# Patient Record
Sex: Male | Born: 1995 | Race: Black or African American | Hispanic: No | Marital: Single | Smoking: Current every day smoker
Health system: Southern US, Community
[De-identification: ages and names within clinical notes are randomized; demographics above are authoritative.]

---
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
## 2015-09-08 IMAGING — CR DG SHOULDER 2+V*R*
2 series · 2 of 2 positions shown · non-contrast
Comparison: None.

CLINICAL DATA: Fall.

EXAM:
RIGHT SHOULDER - 2+ VIEW

[w shoulder ap internal righ]
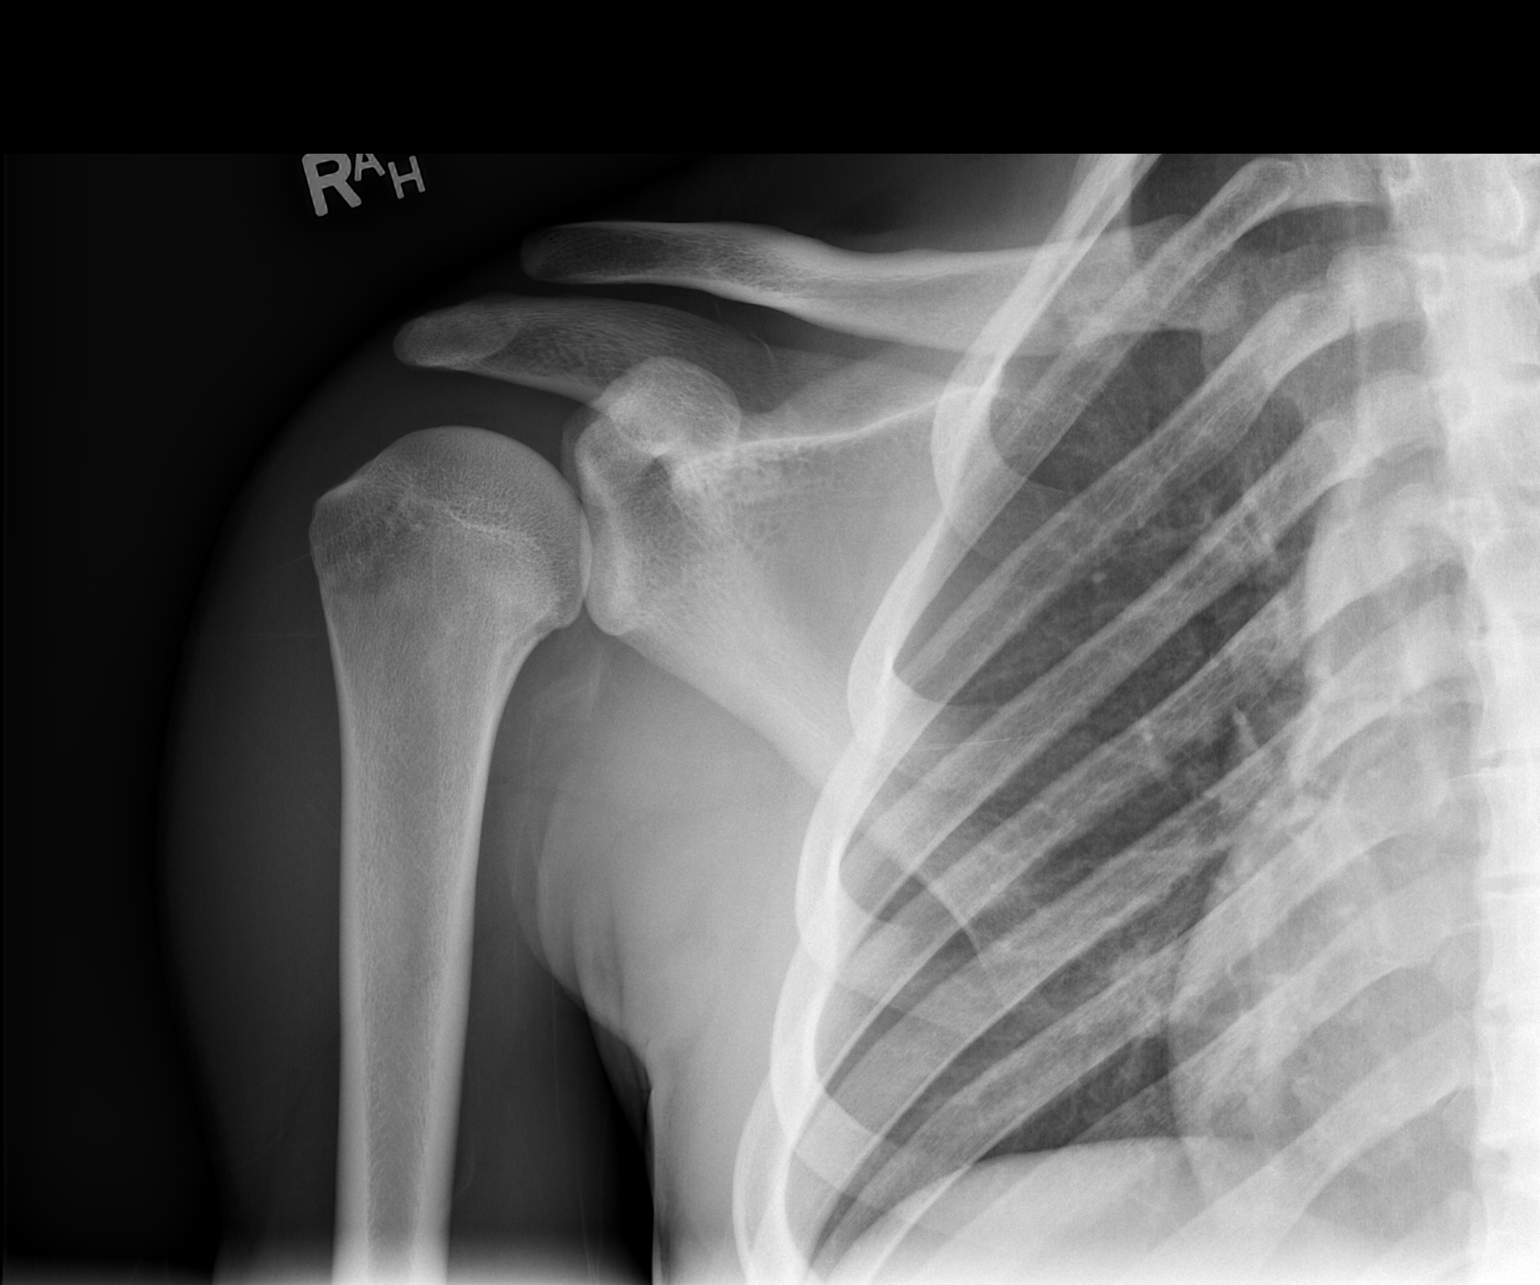

[w shoulder y view right]
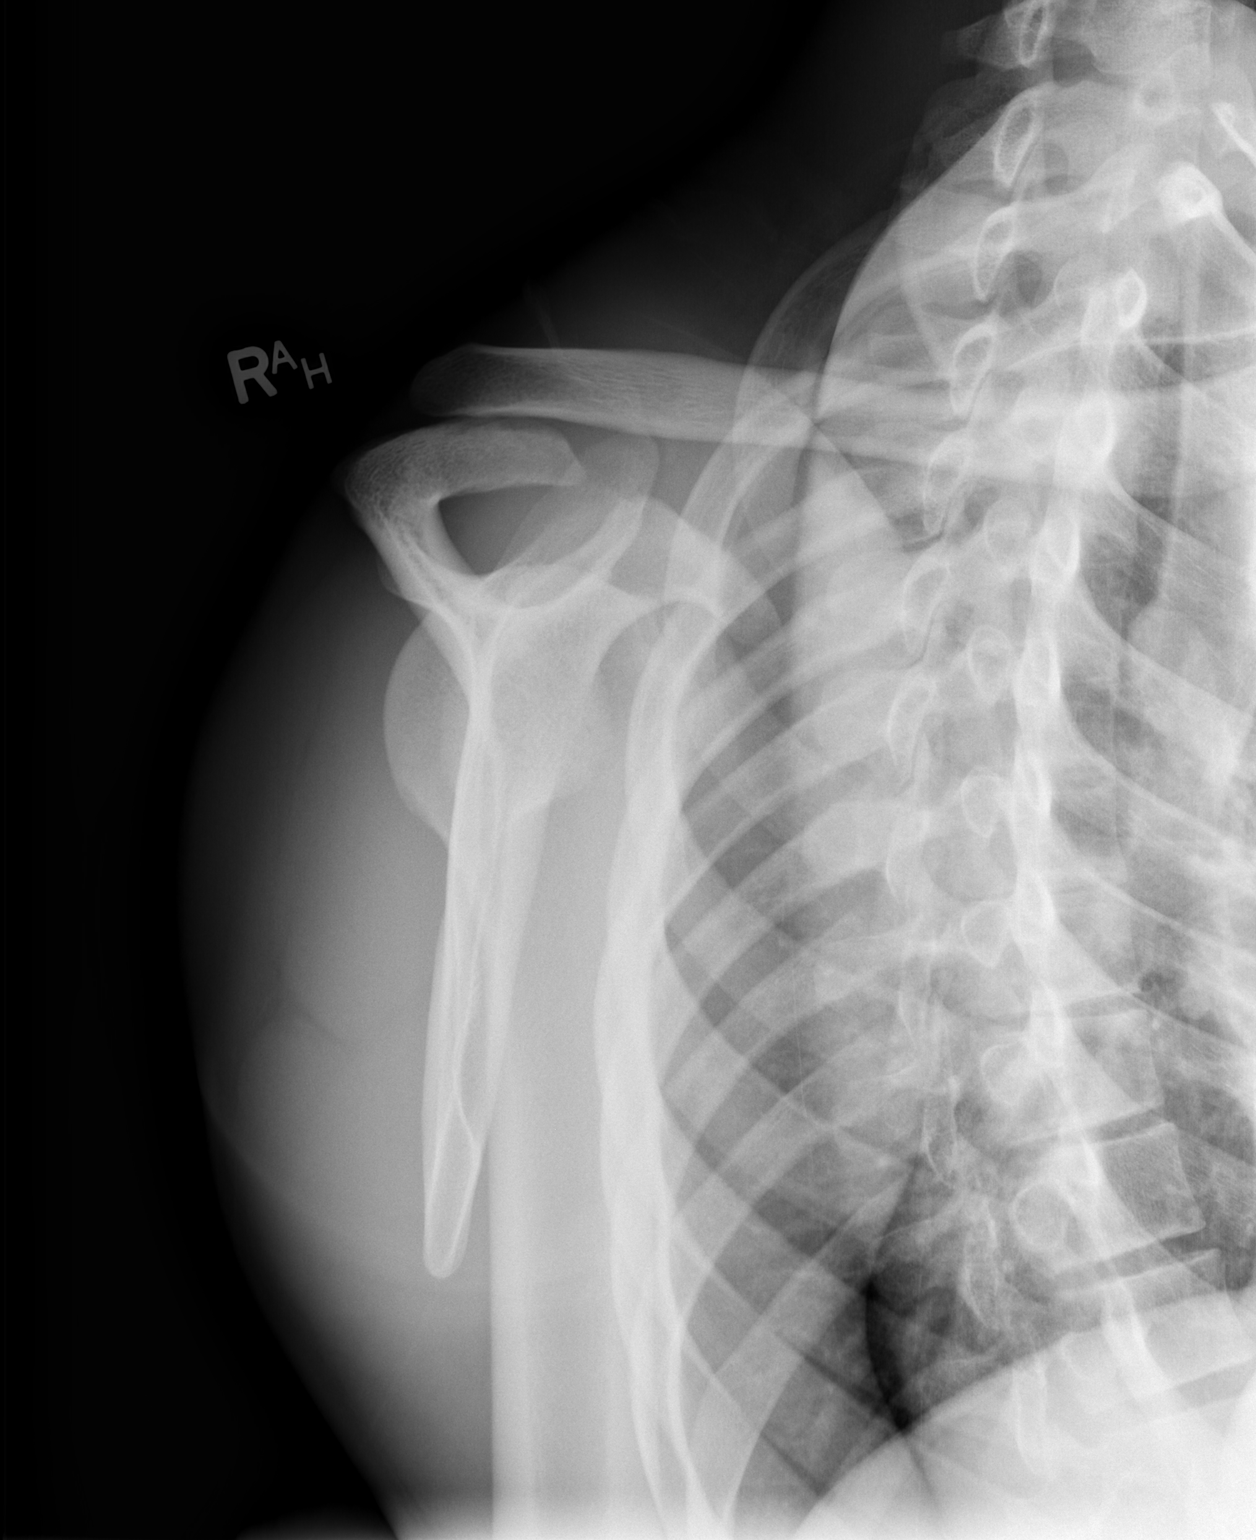

[2 of 2 positions shown; findings below may reference images not displayed]

FINDINGS: Right acromioclavicular separation cannot be excluded. AP views of
both shoulders with without weights should be considered to further
evaluate. No evidence of fracture or dislocation.
IMPRESSION: Cannot exclude right shoulder AC separation. AP views of both
shoulders with and without weights suggested to further evaluate.

## 2017-05-21 ENCOUNTER — Encounter (HOSPITAL_BASED_OUTPATIENT_CLINIC_OR_DEPARTMENT_OTHER): Payer: Self-pay

## 2017-05-21 ENCOUNTER — Emergency Department (HOSPITAL_BASED_OUTPATIENT_CLINIC_OR_DEPARTMENT_OTHER)
Admission: EM | Admit: 2017-05-21 | Discharge: 2017-05-21 | Disposition: A | Discharge status: Home / Self Care | Payer: Self-pay | Attending: Emergency Medicine | Admitting: Emergency Medicine

## 2017-05-21 ENCOUNTER — Other Ambulatory Visit: Payer: Self-pay

## 2017-05-21 DIAGNOSIS — F17210 Nicotine dependence, cigarettes, uncomplicated: Secondary | ICD-10-CM | POA: Insufficient documentation

## 2017-05-21 DIAGNOSIS — Z202 Contact with and (suspected) exposure to infections with a predominantly sexual mode of transmission: Secondary | ICD-10-CM | POA: Insufficient documentation

## 2017-05-21 MED ORDER — PENICILLIN G BENZATHINE 1200000 UNIT/2ML IM SUSP
2.4000 10*6.[IU] | Freq: Once | INTRAMUSCULAR | Status: AC
  Administered 2017-05-21: 2.4 10*6.[IU] via INTRAMUSCULAR
  Filled 2017-05-21: qty 4

## 2017-05-21 MED ORDER — AZITHROMYCIN 250 MG PO TABS
1000.0000 mg | ORAL_TABLET | Freq: Once | ORAL | Status: AC
  Administered 2017-05-21: 1000 mg via ORAL
  Filled 2017-05-21: qty 4

## 2017-05-21 MED ORDER — CEFTRIAXONE SODIUM 250 MG IJ SOLR
250.0000 mg | Freq: Once | INTRAMUSCULAR | Status: AC
  Administered 2017-05-21: 250 mg via INTRAMUSCULAR
  Filled 2017-05-21: qty 250

## 2017-05-21 NOTE — ED Provider Notes (Signed)
[facility] EMERGENCY DEPARTMENT Provider Note   CSN: [id number] Arrival date & time: 05/21/17  1816     History   Chief Complaint Chief Complaint  Patient presents with  . Exposure to STD    HPI Brian Bell is a 22 y.o. male w PMHx of gonorrhea, presenting to the ED with positive exposure to syphillis. Pt states his male sexual partner was recently told she had tested positive for syphilis. He states he is only sexually active with this one male partner without protection. Denies lesions, rashes, dysuria, abd pain, pain with defecation, penile discharge, testicular pain or swelling, or other complaints.  The history is provided by the patient.    History reviewed. No pertinent past medical history.  There are no active problems to display for this patient.   History reviewed. No pertinent surgical history.      Home Medications    Prior to Admission medications   Medication Sig Start Date End Date Taking? Authorizing Provider  HYDROcodone-acetaminophen (NORCO) 5-325 MG per tablet Take 1 tablet by mouth every 6 (six) hours as needed for moderate pain. 09/01/13   Purvis Sheffield, MD    Family History No family history on file.  Social History Social History   Tobacco Use  . Smoking status: Current Every Day Smoker    Packs/day: 1.00    Types: Cigarettes  . Smokeless tobacco: Never Used  Substance Use Topics  . Alcohol use: No  . Drug use: No     Allergies   Patient has no known allergies.   Review of Systems Review of Systems  Constitutional: Negative for fever.  Gastrointestinal: Negative for abdominal pain.  Genitourinary: Negative for discharge, dysuria, penile pain, penile swelling, scrotal swelling and testicular pain.  Skin: Negative for rash.     Physical Exam Updated Vital Signs BP 140/69 (BP Location: Left Arm)   Pulse 83   Temp 98.2 F (36.8 C) (Oral)   Resp 16   Ht 6' (1.829 m)   Wt 68.5 kg (151 lb 0.2 oz)    SpO2 100%   BMI 20.48 kg/m   Physical Exam  Constitutional: He appears well-developed and well-nourished. No distress.  HENT:  Head: Normocephalic and atraumatic.  Eyes: Conjunctivae are normal.  Cardiovascular: Normal rate and intact distal pulses.  Pulmonary/Chest: Effort normal.  Abdominal: Soft. Bowel sounds are normal. He exhibits no distension. There is no tenderness.  Genitourinary: Right testis shows no mass, no swelling and no tenderness. Left testis shows no mass, no swelling and no tenderness. Circumcised. No penile tenderness. No discharge found.  Genitourinary Comments: Exam performed with RN chaperone present.  Psychiatric: He has a normal mood and affect. His behavior is normal.  Nursing note and vitals reviewed.    ED Treatments / Results  Labs (all labs ordered are listed, but only abnormal results are displayed) Labs Reviewed  RPR  HIV ANTIBODY (ROUTINE TESTING)  GC/CHLAMYDIA PROBE AMP ([place]) NOT AT [facility]    EKG None  Radiology No results found.  Procedures Procedures (including critical care time)  Medications Ordered in ED Medications  cefTRIAXone (ROCEPHIN) injection 250 mg (250 mg Intramuscular Given 05/21/17 1850)  azithromycin (ZITHROMAX) tablet 1,000 mg (1,000 mg Oral Given 05/21/17 1853)  penicillin g benzathine (BICILLIN LA) 1200000 UNIT/2ML injection 2.4 Million Units (2.4 Million Units Intramuscular Given 05/21/17 1847)     Initial Impression / Assessment and Plan / ED Course  I have reviewed the triage vital signs and the nursing notes.  Pertinent labs & imaging results that were available during my care of the patient were reviewed by me and considered in my medical decision making (see chart for details).     Patient positive exposure to syphilis.  Patient is afebrile without abdominal tenderness, abdominal pain or painful bowel movements to indicate prostatitis.  No tenderness to palpation of the testes or epididymis to suggest  orchitis or epididymitis.  STD cultures obtained including HIV, syphilis, gonorrhea and chlamydia. Patient has been treated prophylactically with azithromycin and Rocephin, as well as penicillin for syphilis.  Patient to be discharged with instructions to follow up with PCP. Discussed importance of using protection when sexually active. Pt understands that they have STD cultures pending and that they will need to inform all sexual partners if results return positive.   Discussed results, findings, treatment and follow up. Patient advised of return precautions. Patient verbalized understanding and agreed with plan.  Final Clinical Impressions(s) / ED Diagnoses   Final diagnoses:  Exposure to syphilis    ED Discharge Orders    None       Dametri Ozburn, [place] N, PA-C 05/21/17 1924    Melene PlanFloyd, Dan, DO 05/21/17 2259

## 2017-05-21 NOTE — Discharge Instructions (Addendum)
Please read the instructions below.  Talk with your primary care provider about any new medications.  You have been treated today for syphilis, gonorrhea and chlamydia. You will receive a call from the hospital if your test results come back positive. Avoid sexual activity until you know your test results. If your results come back positive, it is important that you inform all of your sexual partners.  Return to the ER for new or worsening symptoms.

## 2017-05-21 NOTE — ED Triage Notes (Signed)
Pt with pos syphilis exposure-NAD-steady gait

## 2017-05-22 LAB — RPR: RPR Ser Ql: NONREACTIVE

## 2017-05-22 LAB — GC/CHLAMYDIA PROBE AMP (~~LOC~~) NOT AT ARMC
Chlamydia: NEGATIVE
NEISSERIA GONORRHEA: NEGATIVE

## 2017-05-22 LAB — HIV ANTIBODY (ROUTINE TESTING W REFLEX): HIV SCREEN 4TH GENERATION: NONREACTIVE

## 2019-02-16 ENCOUNTER — Emergency Department (HOSPITAL_BASED_OUTPATIENT_CLINIC_OR_DEPARTMENT_OTHER)
Admission: EM | Admit: 2019-02-16 | Discharge: 2019-02-16 | Disposition: A | Discharge status: Home / Self Care | Payer: Self-pay | Attending: Emergency Medicine | Admitting: Emergency Medicine

## 2019-02-16 ENCOUNTER — Encounter (HOSPITAL_BASED_OUTPATIENT_CLINIC_OR_DEPARTMENT_OTHER): Payer: Self-pay | Admitting: Emergency Medicine

## 2019-02-16 ENCOUNTER — Other Ambulatory Visit: Payer: Self-pay

## 2019-02-16 DIAGNOSIS — N341 Nonspecific urethritis: Secondary | ICD-10-CM | POA: Insufficient documentation

## 2019-02-16 DIAGNOSIS — F17210 Nicotine dependence, cigarettes, uncomplicated: Secondary | ICD-10-CM | POA: Insufficient documentation

## 2019-02-16 DIAGNOSIS — Z202 Contact with and (suspected) exposure to infections with a predominantly sexual mode of transmission: Secondary | ICD-10-CM | POA: Insufficient documentation

## 2019-02-16 DIAGNOSIS — N342 Other urethritis: Secondary | ICD-10-CM

## 2019-02-16 LAB — HIV ANTIBODY (ROUTINE TESTING W REFLEX): HIV Screen 4th Generation wRfx: NONREACTIVE

## 2019-02-16 MED ORDER — AZITHROMYCIN 1 G PO PACK
1.0000 g | PACK | Freq: Once | ORAL | Status: AC
  Administered 2019-02-16: 1 g via ORAL
  Filled 2019-02-16: qty 1

## 2019-02-16 MED ORDER — CEFTRIAXONE SODIUM 500 MG IJ SOLR
500.0000 mg | Freq: Once | INTRAMUSCULAR | Status: AC
  Administered 2019-02-16: 02:00:00 500 mg via INTRAMUSCULAR
  Filled 2019-02-16: qty 500

## 2019-02-16 NOTE — ED Triage Notes (Signed)
Exposure to Chlamydia. Discharge for 3 days.

## 2019-02-16 NOTE — ED Provider Notes (Signed)
   [facility] Provider Note: Lowella Dell, MD, FACEP  CSN: [id number] MRN: [id number] ARRIVAL: 02/16/19 at 0200 ROOM: MH12/MH12   CHIEF COMPLAINT  Exposure to STD   HISTORY OF PRESENT ILLNESS  02/16/19 2:13 AM Brian Bell is a 24 y.o. male who states he has been exposed to chlamydia.  He is complaining of a clear urethral discharge for the past 3 days.  There is associated burning pain at the urethral meatus which he rates as a 3 out of 10, worse with urination.  He denies abdominal pain.   History reviewed. No pertinent past medical history.  History reviewed. No pertinent surgical history.  No family history on file.  Social History   Tobacco Use  . Smoking status: Current Every Day Smoker    Packs/day: 1.00    Types: Cigarettes  . Smokeless tobacco: Never Used  Substance Use Topics  . Alcohol use: No  . Drug use: No    Prior to Admission medications   Not on File    Allergies Patient has no known allergies.   REVIEW OF SYSTEMS  Negative except as noted here or in the History of Present Illness.   PHYSICAL EXAMINATION  Initial Vital Signs Blood pressure 119/71, pulse 78, temperature 98.2 F (36.8 C), temperature source Oral, resp. rate 20, weight 68.5 kg, SpO2 99 %.  Examination General: Well-developed, well-nourished male in no acute distress; appearance consistent with age of record HENT: normocephalic; atraumatic Eyes: Normal appearance Neck: supple Heart: regular rate and rhythm Lungs: clear to auscultation bilaterally Abdomen: soft; nondistended; nontender GU: Tanner V male, circumcised; no urethral discharge or meatal erythema seen Extremities: No deformity; full range of motion Neurologic: Awake, alert and oriented; motor function intact in all extremities and symmetric; no facial droop Skin: Warm and dry Psychiatric: Normal mood and affect   RESULTS  Summary of this visit's results, reviewed and interpreted by myself:   EKG Interpretation  Date/Time:    Ventricular Rate:    PR Interval:    QRS Duration:   QT Interval:    QTC Calculation:   R Axis:     Text Interpretation:        Laboratory Studies: No results found for this or any previous visit (from the past 24 hour(s)). Imaging Studies: No results found.  ED COURSE and MDM  Nursing notes, initial and subsequent vitals signs, including pulse oximetry, reviewed and interpreted by myself.  Vitals:   02/16/19 0206 02/16/19 0207  BP: 119/71   Pulse: 78   Resp: 20   Temp: 98.2 F (36.8 C)   TempSrc: Oral   SpO2: 99%   Weight:  68.5 kg   Medications  azithromycin (ZITHROMAX) powder 1 g (has no administration in time range)  cefTRIAXone (ROCEPHIN) injection 500 mg (has no administration in time range)   We will treat for gonorrhea and chlamydia.  STD testing sent.   PROCEDURES  Procedures   ED DIAGNOSES     ICD-10-CM   1. Urethritis  N34.2   2. Exposure to STD  Z20.2        Paula Libra, MD 02/16/19 [phone]

## 2019-02-17 LAB — GC/CHLAMYDIA PROBE AMP (~~LOC~~) NOT AT ARMC
Chlamydia: NEGATIVE
Neisseria Gonorrhea: POSITIVE — AB

## 2019-02-17 LAB — RPR: RPR Ser Ql: NONREACTIVE

## 2019-05-24 ENCOUNTER — Encounter (HOSPITAL_BASED_OUTPATIENT_CLINIC_OR_DEPARTMENT_OTHER): Payer: Self-pay | Admitting: Emergency Medicine

## 2019-05-24 DIAGNOSIS — F17210 Nicotine dependence, cigarettes, uncomplicated: Secondary | ICD-10-CM | POA: Insufficient documentation

## 2019-05-24 DIAGNOSIS — N201 Calculus of ureter: Secondary | ICD-10-CM | POA: Insufficient documentation

## 2019-05-24 LAB — URINALYSIS, MICROSCOPIC (REFLEX)
Bacteria, UA: NONE SEEN
RBC / HPF: >50 RBC/hpf (ref 0–5)
Squamous Epithelial / LPF: NONE SEEN (ref 0–5)

## 2019-05-24 LAB — URINALYSIS, ROUTINE W REFLEX MICROSCOPIC
Bilirubin Urine: NEGATIVE
Glucose, UA: NEGATIVE mg/dL
Ketones, ur: 15 mg/dL — AB
Nitrite: NEGATIVE
Protein, ur: 30 mg/dL — AB
Specific Gravity, Urine: 1.025 (ref 1.005–1.030)
pH: 6.5 (ref 5.0–8.0)

## 2019-05-24 NOTE — ED Triage Notes (Signed)
Pt c/o right flank pain

## 2019-05-25 ENCOUNTER — Emergency Department (HOSPITAL_BASED_OUTPATIENT_CLINIC_OR_DEPARTMENT_OTHER)
Admission: EM | Admit: 2019-05-25 | Discharge: 2019-05-25 | Disposition: A | Discharge status: Home / Self Care | Payer: Self-pay | Attending: Emergency Medicine | Admitting: Emergency Medicine

## 2019-05-25 ENCOUNTER — Other Ambulatory Visit: Payer: Self-pay

## 2019-05-25 ENCOUNTER — Emergency Department (HOSPITAL_BASED_OUTPATIENT_CLINIC_OR_DEPARTMENT_OTHER): Payer: Self-pay

## 2019-05-25 DIAGNOSIS — N201 Calculus of ureter: Secondary | ICD-10-CM

## 2019-05-25 MED ORDER — TAMSULOSIN HCL 0.4 MG PO CAPS
0.4000 mg | ORAL_CAPSULE | Freq: Once | ORAL | Status: AC
  Administered 2019-05-25: 0.4 mg via ORAL
  Filled 2019-05-25: qty 1

## 2019-05-25 MED ORDER — ONDANSETRON HCL 4 MG/2ML IJ SOLN
4.0000 mg | Freq: Once | INTRAMUSCULAR | Status: AC
Start: 2019-05-25 — End: 2019-05-25
  Administered 2019-05-25: 02:00:00 4 mg via INTRAVENOUS
  Filled 2019-05-25: qty 2

## 2019-05-25 MED ORDER — HYDROMORPHONE HCL 1 MG/ML IJ SOLN
1.0000 mg | Freq: Once | INTRAMUSCULAR | Status: AC
  Administered 2019-05-25: 1 mg via INTRAVENOUS
  Filled 2019-05-25: qty 1

## 2019-05-25 MED ORDER — HYDROMORPHONE HCL 2 MG PO TABS: 2.0000 mg | ORAL_TABLET | ORAL | 0 refills | Status: AC | PRN

## 2019-05-25 MED ORDER — ONDANSETRON 8 MG PO TBDP: 8.0000 mg | ORAL_TABLET | Freq: Three times a day (TID) | ORAL | 0 refills | Status: DC | PRN

## 2019-05-25 MED ORDER — TAMSULOSIN HCL 0.4 MG PO CAPS: ORAL_CAPSULE | ORAL | 0 refills | Status: AC

## 2019-05-25 NOTE — ED Provider Notes (Signed)
[facility] Provider Note: Lowella Dell, MD, FACEP  CSN: [id number] MRN: [id number] ARRIVAL: 05/24/19 at 2308 ROOM: MH08/MH08   CHIEF COMPLAINT  Flank Pain   HISTORY OF PRESENT ILLNESS  05/25/19 1:31 AM Brian Bell is a 24 y.o. male who complains of a 4-day history of intermittent pain in his right flank radiating to his right lower quadrant.  He rates the pain is an 8 out of 10.  It is not significantly made better or worse with anything.  He has not noticed hematuria but has felt constipated.  He has had associated nausea and vomiting.  He has no history of kidney stones.   History reviewed. No pertinent past medical history.  History reviewed. No pertinent surgical history.  No family history on file.  Social History   Tobacco Use  . Smoking status: Current Every Day Smoker    Packs/day: 1.00    Types: Cigarettes  . Smokeless tobacco: Never Used  Substance Use Topics  . Alcohol use: No  . Drug use: No    Prior to Admission medications   Medication Sig Start Date End Date Taking? Authorizing Provider  HYDROmorphone (DILAUDID) 2 MG tablet Take 1 tablet (2 mg total) by mouth every 4 (four) hours as needed for severe pain. 05/25/19   Aayden Cefalu, Jonny Ruiz, MD  ondansetron (ZOFRAN ODT) 8 MG disintegrating tablet Take 1 tablet (8 mg total) by mouth every 8 (eight) hours as needed for nausea or vomiting. 05/25/19   Zella Dewan, MD  tamsulosin (FLOMAX) 0.4 MG CAPS capsule Take 1 tablet daily until stone passes. 05/25/19   Ameri Cahoon, Jonny Ruiz, MD    Allergies Patient has no known allergies.   REVIEW OF SYSTEMS  Negative except as noted here or in the History of Present Illness.   PHYSICAL EXAMINATION  Initial Vital Signs Blood pressure 126/68, pulse 71, temperature 98.8 F (37.1 C), temperature source Oral, resp. rate 16, height 6' (1.829 m), weight 70.3 kg, SpO2 100 %.  Examination General: Well-developed, well-nourished male in no acute distress; appearance  consistent with age of record HENT: normocephalic; atraumatic Eyes: Normal appearance Neck: supple Heart: regular rate and rhythm Lungs: clear to auscultation bilaterally Abdomen: soft; nondistended; right lower quadrant tenderness; bowel sounds present GU: Right CVA tenderness Extremities: No deformity; full range of motion Neurologic: Awake, alert and oriented; motor function intact in all extremities and symmetric; no facial droop Skin: Warm and dry Psychiatric: Agitated   RESULTS  Summary of this visit's results, reviewed and interpreted by myself:   EKG Interpretation  Date/Time:    Ventricular Rate:    PR Interval:    QRS Duration:   QT Interval:    QTC Calculation:   R Axis:     Text Interpretation:        Laboratory Studies: Results for orders placed or performed during the hospital encounter of 05/25/19 (from the past 24 hour(s))  Urinalysis, Routine w reflex microscopic     Status: Abnormal   Collection Time: 05/24/19 11:29 PM  Result Value Ref Range   Color, Urine YELLOW YELLOW   APPearance CLOUDY (A) CLEAR   Specific Gravity, Urine 1.025 1.005 - 1.030   pH 6.5 5.0 - 8.0   Glucose, UA NEGATIVE NEGATIVE mg/dL   Hgb urine dipstick LARGE (A) NEGATIVE   Bilirubin Urine NEGATIVE NEGATIVE   Ketones, ur 15 (A) NEGATIVE mg/dL   Protein, ur 30 (A) NEGATIVE mg/dL   Nitrite NEGATIVE NEGATIVE   Leukocytes,Ua TRACE (A) NEGATIVE  Urinalysis,  Microscopic (reflex)     Status: None   Collection Time: 05/24/19 11:29 PM  Result Value Ref Range   RBC / HPF >50 0 - 5 RBC/hpf   WBC, UA 0-5 0 - 5 WBC/hpf   Bacteria, UA NONE SEEN NONE SEEN   Squamous Epithelial / LPF NONE SEEN 0 - 5   Mucus PRESENT    Ca Oxalate Crys, UA PRESENT    Imaging Studies: CT Renal Stone Study  Result Date: 05/25/2019 CLINICAL DATA:  Right flank pain x3 days. EXAM: CT ABDOMEN AND PELVIS WITHOUT CONTRAST TECHNIQUE: Multidetector CT imaging of the abdomen and pelvis was performed following the  standard protocol without IV contrast. COMPARISON:  None. FINDINGS: Lower chest: No acute abnormality. Hepatobiliary: No focal liver abnormality is seen. No gallstones, gallbladder wall thickening, or biliary dilatation. Pancreas: Unremarkable. No pancreatic ductal dilatation or surrounding inflammatory changes. Spleen: Normal in size without focal abnormality. Adrenals/Urinary Tract: Adrenal glands are unremarkable. Kidneys are normal in size without focal lesions. A 4 mm obstructing renal stone is seen within the mid right ureter with mild right-sided hydronephrosis and hydroureter. A 3 mm nonobstructing renal stone is seen within the left kidney. Bladder is unremarkable. Stomach/Bowel: Stomach is within normal limits. Appendix appears normal. No evidence of bowel wall thickening, distention, or inflammatory changes. Vascular/Lymphatic: No significant vascular findings are present. No enlarged abdominal or pelvic lymph nodes. Reproductive: Prostate is unremarkable. Other: No abdominal wall hernia or abnormality. No abdominopelvic ascites. Musculoskeletal: No acute or significant osseous findings. IMPRESSION: 1. 4 mm obstructing renal stone within the mid right ureter with mild right-sided hydronephrosis and hydroureter. 2. 3 mm nonobstructing renal stone within the left kidney. Electronically Signed   By: Virgina Norfolk M.D.   On: 05/25/2019 00:45    ED COURSE and MDM  Nursing notes, initial and subsequent vitals signs, including pulse oximetry, reviewed and interpreted by myself.  Vitals:   05/24/19 2324 05/24/19 2326  BP:  126/68  Pulse:  71  Resp:  16  Temp:  98.8 F (37.1 C)  TempSrc:  Oral  SpO2:  100%  Weight: 70.3 kg   Height: 6' (1.829 m)    Medications  tamsulosin (FLOMAX) capsule 0.4 mg (has no administration in time range)  ondansetron (ZOFRAN) injection 4 mg (4 mg Intravenous Given 05/25/19 0147)  HYDROmorphone (DILAUDID) injection 1 mg (1 mg Intravenous Given 05/25/19 0147)     2:05 AM Patient resting comfortably after IV medications.  We will treat him for ureteral colic and refer to [facility] if stone does not pass on its own.  PROCEDURES  Procedures   ED DIAGNOSES     ICD-10-CM   1. Ureterolithiasis  N20.1        Haniyah Maciolek, MD 05/25/19 [phone]

## 2019-06-07 ENCOUNTER — Other Ambulatory Visit: Payer: Self-pay

## 2019-06-07 ENCOUNTER — Emergency Department (HOSPITAL_BASED_OUTPATIENT_CLINIC_OR_DEPARTMENT_OTHER)
Admission: EM | Admit: 2019-06-07 | Discharge: 2019-06-08 | Disposition: A | Discharge status: Home / Self Care | Payer: Self-pay | Attending: Emergency Medicine | Admitting: Emergency Medicine

## 2019-06-07 DIAGNOSIS — N23 Unspecified renal colic: Secondary | ICD-10-CM | POA: Insufficient documentation

## 2019-06-07 DIAGNOSIS — F17210 Nicotine dependence, cigarettes, uncomplicated: Secondary | ICD-10-CM | POA: Insufficient documentation

## 2019-06-08 ENCOUNTER — Encounter (HOSPITAL_BASED_OUTPATIENT_CLINIC_OR_DEPARTMENT_OTHER): Payer: Self-pay | Admitting: *Deleted

## 2019-06-08 ENCOUNTER — Emergency Department (HOSPITAL_BASED_OUTPATIENT_CLINIC_OR_DEPARTMENT_OTHER): Payer: Self-pay

## 2019-06-08 LAB — URINALYSIS, MICROSCOPIC (REFLEX): Bacteria, UA: NONE SEEN

## 2019-06-08 LAB — BASIC METABOLIC PANEL
Anion gap: 9 (ref 5–15)
BUN: 8 mg/dL (ref 6–20)
CO2: 24 mmol/L (ref 22–32)
Calcium: 9 mg/dL (ref 8.9–10.3)
Chloride: 105 mmol/L (ref 98–111)
Creatinine, Ser: 1.17 mg/dL (ref 0.61–1.24)
GFR calc Af Amer: >60 mL/min (ref >60)
GFR calc non Af Amer: >60 mL/min (ref >60)
Glucose, Bld: 84 mg/dL (ref 70–99)
Potassium: 3.7 mmol/L (ref 3.5–5.1)
Sodium: 138 mmol/L (ref 135–145)

## 2019-06-08 LAB — CBC WITH DIFFERENTIAL/PLATELET
Abs Immature Granulocytes: 0.02 10*3/uL (ref 0.00–0.07)
Basophils Absolute: 0 10*3/uL (ref 0.0–0.1)
Basophils Relative: 0 %
Eosinophils Absolute: 0.1 10*3/uL (ref 0.0–0.5)
Eosinophils Relative: 2 %
HCT: 38.3 % — ABNORMAL LOW (ref 39.0–52.0)
Hemoglobin: 12.9 g/dL — ABNORMAL LOW (ref 13.0–17.0)
Immature Granulocytes: 0 %
Lymphocytes Relative: 49 %
Lymphs Abs: 4.2 10*3/uL — ABNORMAL HIGH (ref 0.7–4.0)
MCH: 29.5 pg (ref 26.0–34.0)
MCHC: 33.7 g/dL (ref 30.0–36.0)
MCV: 87.4 fL (ref 80.0–100.0)
Monocytes Absolute: 0.8 10*3/uL (ref 0.1–1.0)
Monocytes Relative: 10 %
Neutro Abs: 3.3 10*3/uL (ref 1.7–7.7)
Neutrophils Relative %: 39 %
Platelets: 247 10*3/uL (ref 150–400)
RBC: 4.38 MIL/uL (ref 4.22–5.81)
RDW: 12.6 % (ref 11.5–15.5)
WBC: 8.5 10*3/uL (ref 4.0–10.5)
nRBC: 0 % (ref 0.0–0.2)

## 2019-06-08 LAB — URINALYSIS, ROUTINE W REFLEX MICROSCOPIC
Bilirubin Urine: NEGATIVE
Glucose, UA: NEGATIVE mg/dL
Ketones, ur: NEGATIVE mg/dL
Leukocytes,Ua: NEGATIVE
Nitrite: NEGATIVE
Protein, ur: NEGATIVE mg/dL
Specific Gravity, Urine: 1.025 (ref 1.005–1.030)
pH: 6 (ref 5.0–8.0)

## 2019-06-08 MED ORDER — MORPHINE SULFATE (PF) 4 MG/ML IV SOLN
4.0000 mg | Freq: Once | INTRAVENOUS | Status: AC
  Administered 2019-06-08: 4 mg via INTRAVENOUS
  Filled 2019-06-08: qty 1

## 2019-06-08 MED ORDER — SODIUM CHLORIDE 0.9 % IV BOLUS
1000.0000 mL | Freq: Once | INTRAVENOUS | Status: AC
  Administered 2019-06-08: 1000 mL via INTRAVENOUS

## 2019-06-08 MED ORDER — ONDANSETRON 8 MG PO TBDP: 8.0000 mg | ORAL_TABLET | Freq: Three times a day (TID) | ORAL | 0 refills | Status: AC | PRN

## 2019-06-08 MED ORDER — KETOROLAC TROMETHAMINE 15 MG/ML IJ SOLN
15.0000 mg | Freq: Once | INTRAMUSCULAR | Status: AC
  Administered 2019-06-08: 15 mg via INTRAVENOUS
  Filled 2019-06-08: qty 1

## 2019-06-08 MED ORDER — OXYCODONE-ACETAMINOPHEN 5-325 MG PO TABS
1.0000 | ORAL_TABLET | Freq: Once | ORAL | Status: AC
  Administered 2019-06-08: 1 via ORAL
  Filled 2019-06-08: qty 1

## 2019-06-08 MED ORDER — ONDANSETRON HCL 4 MG/2ML IJ SOLN
4.0000 mg | Freq: Once | INTRAMUSCULAR | Status: AC
  Administered 2019-06-08: 4 mg via INTRAVENOUS
  Filled 2019-06-08: qty 2

## 2019-06-08 NOTE — ED Notes (Signed)
ED Provider at bedside. 

## 2019-06-08 NOTE — Discharge Instructions (Addendum)
You likely are passing or recently passed a kidney stone on the right side.  Continue pain and nausea medications at home.  If you have worsening or recurrence of symptoms, call urology.

## 2019-06-08 NOTE — ED Provider Notes (Signed)
[facility] EMERGENCY DEPARTMENT Provider Note   CSN: [id number] Arrival date & time: 06/07/19  2357     History Chief Complaint  Patient presents with  . Nephrolithiasis    Brian Bell is a 24 y.o. male.  HPI     This is a 24 year old male who presents with right-sided abdominal pain.  Patient reports acute onset of pain 1 hour prior to arrival.  It is similar in character to the pain he had when he was diagnosed with kidney stones at the end of April.  He states that since that time he has not had any significant pain but tonight pain returned significantly.  He reports one episode of nonbilious, nonbloody emesis.  Rates his pain a 10 out of 10.  It radiates into his testicle.  He has noted hematuria without dysuria.  He also reports difficulty urinating.  History reviewed. No pertinent past medical history.  There are no problems to display for this patient.   History reviewed. No pertinent surgical history.     History reviewed. No pertinent family history.  Social History   Tobacco Use  . Smoking status: Current Every Day Smoker    Packs/day: 1.00    Types: Cigarettes  . Smokeless tobacco: Never Used  Substance Use Topics  . Alcohol use: No  . Drug use: No    Home Medications Prior to Admission medications   Medication Sig Start Date End Date Taking? Authorizing Provider  HYDROmorphone (DILAUDID) 2 MG tablet Take 1 tablet (2 mg total) by mouth every 4 (four) hours as needed for severe pain. 05/25/19   Molpus, Jonny Ruiz, MD  ondansetron (ZOFRAN ODT) 8 MG disintegrating tablet Take 1 tablet (8 mg total) by mouth every 8 (eight) hours as needed for nausea or vomiting. 06/08/19   Consuello Lassalle, Mayer Masker, MD  tamsulosin (FLOMAX) 0.4 MG CAPS capsule Take 1 tablet daily until stone passes. 05/25/19   Molpus, Jonny Ruiz, MD    Allergies    Patient has no known allergies.  Review of Systems   Review of Systems  Constitutional: Negative for fever.  Respiratory:  Negative for shortness of breath.   Cardiovascular: Negative for chest pain.  Gastrointestinal: Positive for abdominal pain, nausea and vomiting.  Genitourinary: Positive for difficulty urinating and hematuria.  All other systems reviewed and are negative.   Physical Exam Updated Vital Signs BP 113/67 (BP Location: Left Arm)   Pulse 67   Temp 97.6 F (36.4 C) (Oral)   Resp 16   Ht 1.829 m (6')   Wt 70.3 kg   SpO2 99%   BMI 21.02 kg/m   Physical Exam Vitals and nursing note reviewed.  Constitutional:      Appearance: He is well-developed.     Comments: Uncomfortable appearing but nontoxic  HENT:     Head: Normocephalic and atraumatic.     Mouth/Throat:     Mouth: Mucous membranes are moist.  Eyes:     Pupils: Pupils are equal, round, and reactive to light.  Cardiovascular:     Rate and Rhythm: Normal rate and regular rhythm.     Heart sounds: No murmur.  Pulmonary:     Effort: Pulmonary effort is normal. No respiratory distress.  Abdominal:     General: Bowel sounds are normal.     Palpations: Abdomen is soft.     Tenderness: There is no abdominal tenderness. There is no right CVA tenderness, left CVA tenderness or rebound.  Musculoskeletal:     Cervical back:  Neck supple.  Lymphadenopathy:     Cervical: No cervical adenopathy.  Skin:    General: Skin is warm and dry.  Neurological:     Mental Status: He is alert and oriented to person, place, and time.  Psychiatric:        Mood and Affect: Mood normal.     ED Results / Procedures / Treatments   Labs (all labs ordered are listed, but only abnormal results are displayed) Labs Reviewed  CBC WITH DIFFERENTIAL/PLATELET - Abnormal; Notable for the following components:      Result Value   Hemoglobin 12.9 (*)    HCT 38.3 (*)    Lymphs Abs 4.2 (*)    All other components within normal limits  URINALYSIS, ROUTINE W REFLEX MICROSCOPIC - Abnormal; Notable for the following components:   APPearance CLOUDY (*)     Hgb urine dipstick LARGE (*)    All other components within normal limits  BASIC METABOLIC PANEL  URINALYSIS, MICROSCOPIC (REFLEX)    EKG None  Radiology DG Abdomen 1 View  Result Date: 06/08/2019 CLINICAL DATA:  Kidney stone, right mid ureter 4 mm calculus seen on 05/25/2019 EXAM: ABDOMEN - 1 VIEW COMPARISON:  05/25/2019 FINDINGS: The bowel gas pattern is normal. There is a punctate 3 mm calcification seen in the lower right pelvis which could represent a bladder calculi. No other radiopaque calculi are noted. IMPRESSION: 3 mm lower right pelvic calcification which could represent a bladder calculi. Electronically Signed   By: Prudencio Pair M.D.   On: 06/08/2019 00:24    Procedures Procedures (including critical care time)  Medications Ordered in ED Medications  sodium chloride 0.9 % bolus 1,000 mL ( Intravenous Stopped 06/08/19 0145)  morphine 4 MG/ML injection 4 mg (4 mg Intravenous Given 06/08/19 0036)  ondansetron (ZOFRAN) injection 4 mg (4 mg Intravenous Given 06/08/19 0035)  ketorolac (TORADOL) 15 MG/ML injection 15 mg (15 mg Intravenous Given 06/08/19 0035)  morphine 4 MG/ML injection 4 mg (4 mg Intravenous Given 06/08/19 0155)  oxyCODONE-acetaminophen (PERCOCET/ROXICET) 5-325 MG per tablet 1 tablet (1 tablet Oral Given 06/08/19 0155)    ED Course  I have reviewed the triage vital signs and the nursing notes.  Pertinent labs & imaging results that were available during my care of the patient were reviewed by me and considered in my medical decision making (see chart for details).    MDM Rules/Calculators/A&P                       Patient presents with recurrent right-sided abdominal pain.  Recent history of kidney stones diagnosed at the end of April.  He is very uncomfortable appearing but nontoxic and vital signs are reassuring.  No reproducible pain on exam.  I have reviewed his chart.  Stone was in the mid ureter on last evaluation by CT on April 28.  Suspect he may be  passing stone.  Patient was given fluids, pain and nausea medication.  Lab work is stable.  No significant AKI.  He has large hemoglobin in his urine.  No evidence of UTI.  KUB suggestive of distal versus bladder stone.  After several doses of medication, patient now comfortable and tolerating fluids.  He states his pain has significantly improved.  He was unable to get his nausea medication filled because of cost.  He was provided with a good Rx information.  He has pain medication and Flomax at home.  Urology follow-up provided.  After history, exam,  and medical workup I feel the patient has been appropriately medically screened and is safe for discharge home. Pertinent diagnoses were discussed with the patient. Patient was given return precautions.   Final Clinical Impression(s) / ED Diagnoses Final diagnoses:  Renal colic on right side    Rx / DC Orders ED Discharge Orders         Ordered    ondansetron (ZOFRAN ODT) 8 MG disintegrating tablet  Every 8 hours PRN     06/08/19 0241           Shon Baton, MD 06/08/19 [phone]

## 2019-06-08 NOTE — ED Notes (Signed)
PT to xray. Tearful and writhing in pain.No emesis. Pt states last voided 1 hour ago.

## 2019-06-08 NOTE — ED Notes (Signed)
PT tolerated po water and sprite, no emesis. Denies nausea

## 2019-06-08 NOTE — ED Triage Notes (Signed)
Right flank pain radiating into groin that became worse today. Dx with kidney stones 4/28.

## 2021-06-14 IMAGING — DX DG ABDOMEN 1V
2 series · 2 of 2 positions shown · non-contrast
Comparison: 05/25/2019

CLINICAL DATA: Kidney stone, right mid ureter 4 mm calculus seen on
05/25/2019

EXAM:
ABDOMEN - 1 VIEW

[abdomen kub (1 of 2)]
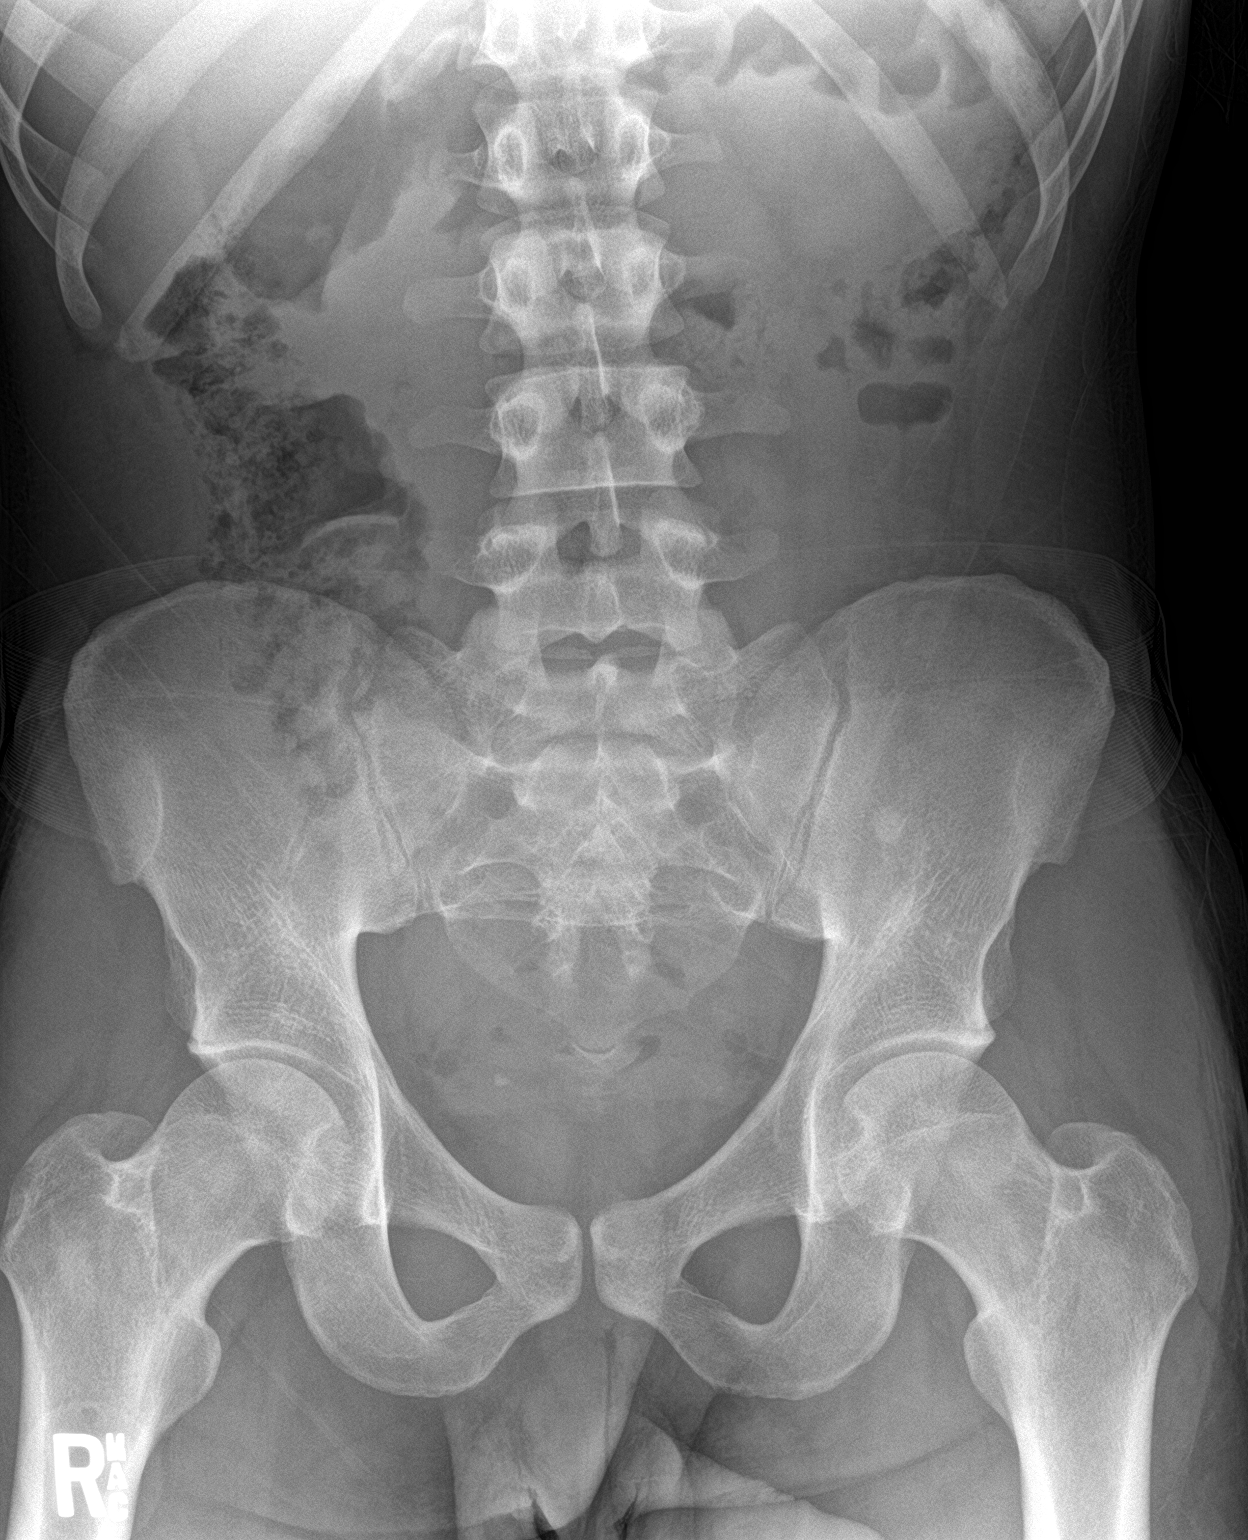

[abdomen kub (2 of 2)]
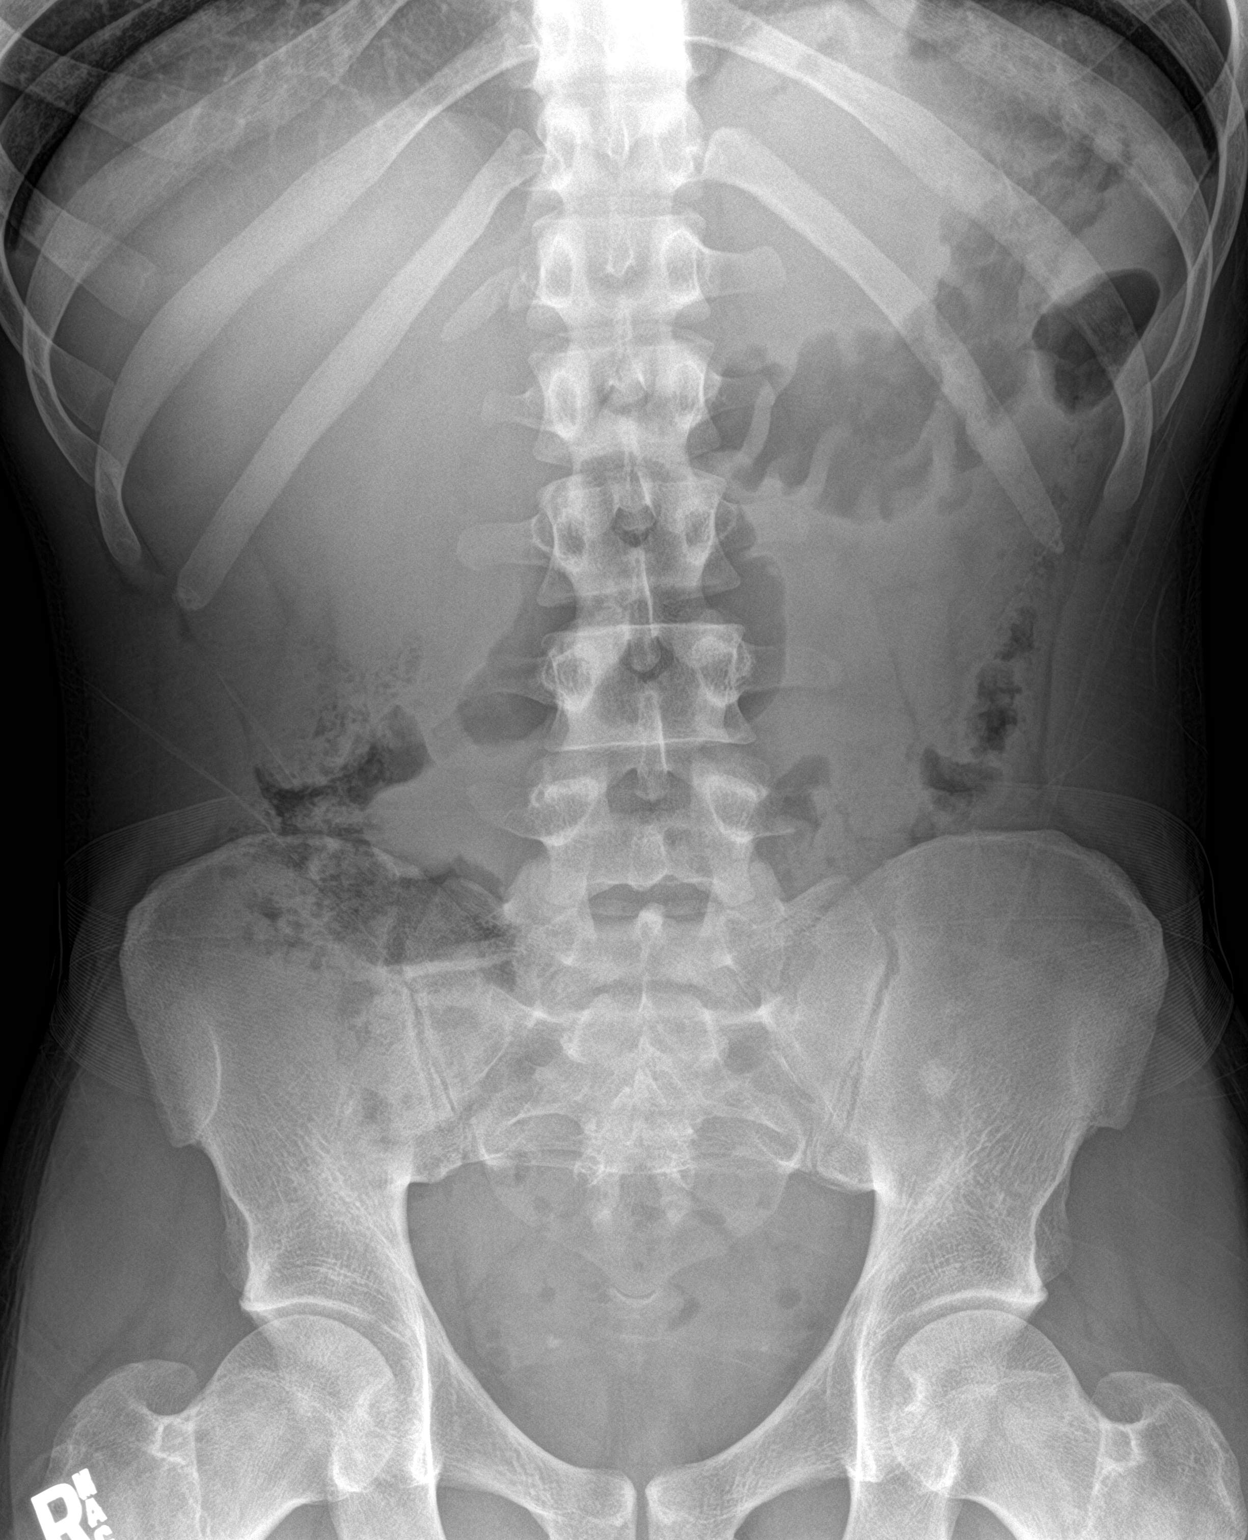

[2 of 2 positions shown; findings below may reference images not displayed]

FINDINGS: The bowel gas pattern is normal. There is a punctate 3 mm
calcification seen in the lower right pelvis which could represent a
bladder calculi. No other radiopaque calculi are noted.
IMPRESSION: 3 mm lower right pelvic calcification which could represent a
bladder calculi.
